# Patient Record
Sex: Female | Born: 1975
Health system: Southern US, Community
[De-identification: ages and names within clinical notes are randomized; demographics above are authoritative.]

## PROBLEM LIST (undated history)

## (undated) DIAGNOSIS — K219 Gastro-esophageal reflux disease without esophagitis: Secondary | ICD-10-CM

## (undated) DIAGNOSIS — E049 Nontoxic goiter, unspecified: Secondary | ICD-10-CM

## (undated) HISTORY — PX: NO PAST SURGERIES: SHX2092

---

## 2008-08-24 ENCOUNTER — Encounter: Admission: RE | Admit: 2008-08-24 | Discharge: 2008-08-24 | Payer: Self-pay | Admitting: Family Medicine

## 2008-09-06 ENCOUNTER — Encounter (INDEPENDENT_AMBULATORY_CARE_PROVIDER_SITE_OTHER): Payer: Self-pay | Admitting: Interventional Radiology

## 2008-09-06 ENCOUNTER — Ambulatory Visit (HOSPITAL_COMMUNITY): Admission: RE | Admit: 2008-09-06 | Discharge: 2008-09-06 | Payer: Self-pay | Admitting: Family Medicine

## 2009-08-26 ENCOUNTER — Encounter: Admission: RE | Admit: 2009-08-26 | Discharge: 2009-08-26 | Payer: Self-pay | Admitting: Internal Medicine

## 2010-09-21 ENCOUNTER — Other Ambulatory Visit (HOSPITAL_COMMUNITY)
Admission: RE | Admit: 2010-09-21 | Discharge: 2010-09-21 | Disposition: A | Discharge status: Home / Self Care | Payer: 59 | Source: Ambulatory Visit | Attending: Family Medicine | Admitting: Family Medicine

## 2010-09-21 ENCOUNTER — Other Ambulatory Visit: Payer: Self-pay | Admitting: Family Medicine

## 2010-09-21 DIAGNOSIS — Z124 Encounter for screening for malignant neoplasm of cervix: Secondary | ICD-10-CM | POA: Insufficient documentation

## 2013-07-21 ENCOUNTER — Other Ambulatory Visit: Payer: Self-pay | Admitting: Family Medicine

## 2013-07-21 DIAGNOSIS — E042 Nontoxic multinodular goiter: Secondary | ICD-10-CM

## 2013-07-30 ENCOUNTER — Ambulatory Visit
Admission: RE | Admit: 2013-07-30 | Discharge: 2013-07-30 | Disposition: A | Discharge status: Home / Self Care | Payer: No Typology Code available for payment source | Source: Ambulatory Visit | Attending: Family Medicine | Admitting: Family Medicine

## 2013-07-30 DIAGNOSIS — E042 Nontoxic multinodular goiter: Secondary | ICD-10-CM

## 2015-02-08 ENCOUNTER — Other Ambulatory Visit: Payer: Self-pay | Admitting: Family Medicine

## 2015-02-08 ENCOUNTER — Other Ambulatory Visit (HOSPITAL_COMMUNITY)
Admission: RE | Admit: 2015-02-08 | Discharge: 2015-02-08 | Disposition: A | Discharge status: Home / Self Care | Payer: 59 | Source: Ambulatory Visit | Attending: Family Medicine | Admitting: Family Medicine

## 2015-02-08 DIAGNOSIS — Z124 Encounter for screening for malignant neoplasm of cervix: Secondary | ICD-10-CM | POA: Diagnosis not present

## 2015-02-10 LAB — CYTOLOGY - PAP

## 2015-02-11 ENCOUNTER — Other Ambulatory Visit: Payer: Self-pay | Admitting: Family Medicine

## 2015-02-11 DIAGNOSIS — E042 Nontoxic multinodular goiter: Secondary | ICD-10-CM

## 2015-02-15 ENCOUNTER — Ambulatory Visit
Admission: RE | Admit: 2015-02-15 | Discharge: 2015-02-15 | Disposition: A | Discharge status: Home / Self Care | Payer: 59 | Source: Ambulatory Visit | Attending: Family Medicine | Admitting: Family Medicine

## 2015-02-15 DIAGNOSIS — E042 Nontoxic multinodular goiter: Secondary | ICD-10-CM

## 2015-03-22 ENCOUNTER — Other Ambulatory Visit: Payer: Self-pay | Admitting: Endocrinology

## 2015-03-22 DIAGNOSIS — E041 Nontoxic single thyroid nodule: Secondary | ICD-10-CM

## 2015-03-30 ENCOUNTER — Ambulatory Visit
Admission: RE | Admit: 2015-03-30 | Discharge: 2015-03-30 | Disposition: A | Discharge status: Home / Self Care | Payer: 59 | Source: Ambulatory Visit | Attending: Endocrinology | Admitting: Endocrinology

## 2015-03-30 ENCOUNTER — Other Ambulatory Visit (HOSPITAL_COMMUNITY)
Admission: RE | Admit: 2015-03-30 | Discharge: 2015-03-30 | Disposition: A | Discharge status: Home / Self Care | Payer: 59 | Source: Ambulatory Visit | Attending: Radiology | Admitting: Radiology

## 2015-03-30 DIAGNOSIS — E041 Nontoxic single thyroid nodule: Secondary | ICD-10-CM | POA: Insufficient documentation

## 2016-02-16 ENCOUNTER — Other Ambulatory Visit: Payer: Self-pay | Admitting: Family Medicine

## 2016-02-16 DIAGNOSIS — N63 Unspecified lump in unspecified breast: Secondary | ICD-10-CM

## 2016-02-16 DIAGNOSIS — E042 Nontoxic multinodular goiter: Secondary | ICD-10-CM

## 2016-02-22 ENCOUNTER — Ambulatory Visit
Admission: RE | Admit: 2016-02-22 | Discharge: 2016-02-22 | Disposition: A | Discharge status: Home / Self Care | Payer: 59 | Source: Ambulatory Visit | Attending: Family Medicine | Admitting: Family Medicine

## 2016-02-22 ENCOUNTER — Other Ambulatory Visit: Payer: Self-pay | Admitting: Family Medicine

## 2016-02-22 DIAGNOSIS — N63 Unspecified lump in unspecified breast: Secondary | ICD-10-CM

## 2016-02-23 ENCOUNTER — Ambulatory Visit
Admission: RE | Admit: 2016-02-23 | Discharge: 2016-02-23 | Disposition: A | Discharge status: Home / Self Care | Payer: 59 | Source: Ambulatory Visit | Attending: Family Medicine | Admitting: Family Medicine

## 2016-02-23 DIAGNOSIS — E042 Nontoxic multinodular goiter: Secondary | ICD-10-CM

## 2016-03-07 ENCOUNTER — Other Ambulatory Visit: Payer: Self-pay | Admitting: Family Medicine

## 2016-03-07 DIAGNOSIS — E041 Nontoxic single thyroid nodule: Secondary | ICD-10-CM

## 2016-03-13 ENCOUNTER — Other Ambulatory Visit (HOSPITAL_COMMUNITY)
Admission: RE | Admit: 2016-03-13 | Discharge: 2016-03-13 | Disposition: A | Discharge status: Home / Self Care | Payer: 59 | Source: Ambulatory Visit | Attending: Radiology | Admitting: Radiology

## 2016-03-13 ENCOUNTER — Ambulatory Visit
Admission: RE | Admit: 2016-03-13 | Discharge: 2016-03-13 | Disposition: A | Discharge status: Home / Self Care | Payer: 59 | Source: Ambulatory Visit | Attending: Family Medicine | Admitting: Family Medicine

## 2016-03-13 DIAGNOSIS — D34 Benign neoplasm of thyroid gland: Secondary | ICD-10-CM | POA: Diagnosis not present

## 2016-03-13 DIAGNOSIS — E041 Nontoxic single thyroid nodule: Secondary | ICD-10-CM

## 2016-07-18 ENCOUNTER — Other Ambulatory Visit: Payer: Self-pay | Admitting: Endocrinology

## 2016-07-18 DIAGNOSIS — E049 Nontoxic goiter, unspecified: Secondary | ICD-10-CM

## 2016-09-20 ENCOUNTER — Ambulatory Visit
Admission: RE | Admit: 2016-09-20 | Discharge: 2016-09-20 | Disposition: A | Discharge status: Home / Self Care | Payer: 59 | Source: Ambulatory Visit | Attending: Endocrinology | Admitting: Endocrinology

## 2016-09-20 DIAGNOSIS — E049 Nontoxic goiter, unspecified: Secondary | ICD-10-CM

## 2017-07-05 ENCOUNTER — Other Ambulatory Visit: Payer: Self-pay | Admitting: Family Medicine

## 2017-07-05 ENCOUNTER — Other Ambulatory Visit (HOSPITAL_COMMUNITY)
Admission: RE | Admit: 2017-07-05 | Discharge: 2017-07-05 | Disposition: A | Discharge status: Home / Self Care | Payer: 59 | Source: Ambulatory Visit | Attending: Family Medicine | Admitting: Family Medicine

## 2017-07-05 DIAGNOSIS — Z124 Encounter for screening for malignant neoplasm of cervix: Secondary | ICD-10-CM | POA: Insufficient documentation

## 2017-07-08 ENCOUNTER — Other Ambulatory Visit: Payer: Self-pay | Admitting: Family Medicine

## 2017-07-08 DIAGNOSIS — N6320 Unspecified lump in the left breast, unspecified quadrant: Secondary | ICD-10-CM

## 2017-07-09 LAB — CYTOLOGY - PAP: Diagnosis: NEGATIVE

## 2017-07-15 ENCOUNTER — Ambulatory Visit
Admission: RE | Admit: 2017-07-15 | Discharge: 2017-07-15 | Disposition: A | Discharge status: Home / Self Care | Payer: 59 | Source: Ambulatory Visit | Attending: Family Medicine | Admitting: Family Medicine

## 2017-07-15 DIAGNOSIS — N6320 Unspecified lump in the left breast, unspecified quadrant: Secondary | ICD-10-CM

## 2017-10-09 ENCOUNTER — Other Ambulatory Visit: Payer: Self-pay | Admitting: Endocrinology

## 2017-10-09 DIAGNOSIS — E049 Nontoxic goiter, unspecified: Secondary | ICD-10-CM

## 2017-10-22 ENCOUNTER — Ambulatory Visit
Admission: RE | Admit: 2017-10-22 | Discharge: 2017-10-22 | Disposition: A | Discharge status: Home / Self Care | Payer: 59 | Source: Ambulatory Visit | Attending: Endocrinology | Admitting: Endocrinology

## 2017-10-22 DIAGNOSIS — E049 Nontoxic goiter, unspecified: Secondary | ICD-10-CM

## 2019-03-03 ENCOUNTER — Emergency Department (HOSPITAL_COMMUNITY)
Admission: EM | Admit: 2019-03-03 | Discharge: 2019-03-04 | Discharge status: Against Medical Advice | Payer: 59 | Attending: Emergency Medicine | Admitting: Emergency Medicine

## 2019-03-03 ENCOUNTER — Encounter (HOSPITAL_COMMUNITY): Payer: Self-pay | Admitting: Emergency Medicine

## 2019-03-03 ENCOUNTER — Other Ambulatory Visit: Payer: Self-pay

## 2019-03-03 DIAGNOSIS — Z5321 Procedure and treatment not carried out due to patient leaving prior to being seen by health care provider: Secondary | ICD-10-CM | POA: Insufficient documentation

## 2019-03-03 HISTORY — DX: Nontoxic goiter, unspecified: E04.9

## 2019-03-03 HISTORY — DX: Gastro-esophageal reflux disease without esophagitis: K21.9

## 2019-03-03 LAB — COMPREHENSIVE METABOLIC PANEL
ALT: 15 U/L (ref 0–44)
AST: 18 U/L (ref 15–41)
Albumin: 3.8 g/dL (ref 3.5–5.0)
Alkaline Phosphatase: 45 U/L (ref 38–126)
Anion gap: 12 (ref 5–15)
BUN: 9 mg/dL (ref 6–20)
CO2: 23 mmol/L (ref 22–32)
Calcium: 9.6 mg/dL (ref 8.9–10.3)
Chloride: 101 mmol/L (ref 98–111)
Creatinine, Ser: 0.77 mg/dL (ref 0.44–1.00)
GFR calc Af Amer: >60 mL/min (ref >60)
GFR calc non Af Amer: >60 mL/min (ref >60)
Glucose, Bld: 147 mg/dL — ABNORMAL HIGH (ref 70–99)
Potassium: 3.9 mmol/L (ref 3.5–5.1)
Sodium: 136 mmol/L (ref 135–145)
Total Bilirubin: 0.8 mg/dL (ref 0.3–1.2)
Total Protein: 7.3 g/dL (ref 6.5–8.1)

## 2019-03-03 LAB — CBC
HCT: 41.1 % (ref 36.0–46.0)
Hemoglobin: 14.9 g/dL (ref 12.0–15.0)
MCH: 32.6 pg (ref 26.0–34.0)
MCHC: 36.3 g/dL — ABNORMAL HIGH (ref 30.0–36.0)
MCV: 89.9 fL (ref 80.0–100.0)
Platelets: 329 10*3/uL (ref 150–400)
RBC: 4.57 MIL/uL (ref 3.87–5.11)
RDW: 11.8 % (ref 11.5–15.5)
WBC: 9.8 10*3/uL (ref 4.0–10.5)
nRBC: 0 % (ref 0.0–0.2)

## 2019-03-03 LAB — URINALYSIS, ROUTINE W REFLEX MICROSCOPIC
Bilirubin Urine: NEGATIVE
Glucose, UA: NEGATIVE mg/dL
Hgb urine dipstick: NEGATIVE
Ketones, ur: 20 mg/dL — AB
Leukocytes,Ua: NEGATIVE
Nitrite: NEGATIVE
Protein, ur: 30 mg/dL — AB
Specific Gravity, Urine: 1.027 (ref 1.005–1.030)
pH: 7 (ref 5.0–8.0)

## 2019-03-03 LAB — LIPASE, BLOOD: Lipase: 22 U/L (ref 11–51)

## 2019-03-03 LAB — I-STAT BETA HCG BLOOD, ED (MC, WL, AP ONLY): I-stat hCG, quantitative: <5 m[IU]/mL (ref <5)

## 2019-03-03 MED ORDER — SODIUM CHLORIDE 0.9% FLUSH: 3.0000 mL | Freq: Once | INTRAVENOUS | Status: DC

## 2019-03-03 NOTE — ED Triage Notes (Signed)
Pt to ED with c/o nausea, vomiting and diarrhea   Onset this am.  Unable to keep anything down

## 2019-03-04 NOTE — ED Notes (Signed)
NO answer when called for bed

## 2019-06-08 ENCOUNTER — Other Ambulatory Visit (HOSPITAL_COMMUNITY): Payer: Self-pay | Admitting: Endocrinology

## 2019-06-08 DIAGNOSIS — E0590 Thyrotoxicosis, unspecified without thyrotoxic crisis or storm: Secondary | ICD-10-CM

## 2019-06-22 ENCOUNTER — Encounter (HOSPITAL_COMMUNITY): Payer: Self-pay

## 2019-06-22 ENCOUNTER — Encounter (HOSPITAL_COMMUNITY): Payer: 59

## 2019-06-23 ENCOUNTER — Encounter (HOSPITAL_COMMUNITY): Payer: 59

## 2019-07-01 ENCOUNTER — Other Ambulatory Visit: Payer: Self-pay

## 2019-07-01 ENCOUNTER — Encounter (HOSPITAL_COMMUNITY)
Admission: RE | Admit: 2019-07-01 | Discharge: 2019-07-01 | Disposition: A | Discharge status: Home / Self Care | Payer: 59 | Source: Ambulatory Visit | Attending: Endocrinology | Admitting: Endocrinology

## 2019-07-01 DIAGNOSIS — E0590 Thyrotoxicosis, unspecified without thyrotoxic crisis or storm: Secondary | ICD-10-CM

## 2019-07-01 MED ORDER — SODIUM IODIDE I-123 7.4 MBQ CAPS
452.2000 | ORAL_CAPSULE | Freq: Once | ORAL | Status: AC
  Administered 2019-07-01: 452.2 via ORAL

## 2019-07-02 ENCOUNTER — Encounter (HOSPITAL_COMMUNITY)
Admission: RE | Admit: 2019-07-02 | Discharge: 2019-07-02 | Disposition: A | Discharge status: Home / Self Care | Payer: 59 | Source: Ambulatory Visit | Attending: Endocrinology | Admitting: Endocrinology

## 2019-07-02 MED ORDER — SODIUM IODIDE I-123 7.4 MBQ CAPS
418.0000 | ORAL_CAPSULE | Freq: Once | ORAL | Status: AC
  Administered 2019-07-02: 17:00:00 418 via ORAL

## 2019-08-25 ENCOUNTER — Other Ambulatory Visit: Payer: Self-pay | Admitting: Endocrinology

## 2019-08-25 DIAGNOSIS — E049 Nontoxic goiter, unspecified: Secondary | ICD-10-CM

## 2020-08-12 DIAGNOSIS — E049 Nontoxic goiter, unspecified: Secondary | ICD-10-CM | POA: Diagnosis not present

## 2020-08-19 DIAGNOSIS — E049 Nontoxic goiter, unspecified: Secondary | ICD-10-CM | POA: Diagnosis not present

## 2020-08-19 DIAGNOSIS — E0590 Thyrotoxicosis, unspecified without thyrotoxic crisis or storm: Secondary | ICD-10-CM | POA: Diagnosis not present

## 2020-08-24 ENCOUNTER — Other Ambulatory Visit: Payer: 59

## 2020-09-06 ENCOUNTER — Ambulatory Visit
Admission: RE | Admit: 2020-09-06 | Discharge: 2020-09-06 | Disposition: A | Discharge status: Home / Self Care | Payer: Self-pay | Source: Ambulatory Visit | Attending: Endocrinology | Admitting: Endocrinology

## 2020-09-06 DIAGNOSIS — E049 Nontoxic goiter, unspecified: Secondary | ICD-10-CM

## 2021-02-14 IMAGING — NM NM THYROID IMAGING W/ UPTAKE MULTI (4&24 HR)
4 series · 4 of 4 positions shown · non-contrast
Comparison: Thyroid ultrasound 10/22/2017

CLINICAL DATA: Thyrotoxicosis without thyroid storm

EXAM:
THYROID SCAN AND UPTAKE - 4 AND 24 HOURS
TECHNIQUE: Following oral administration of 6-5OM capsule, anterior planar
imaging was acquired at 24 hours. Thyroid uptake was calculated with
a thyroid probe at 4-6 hours and 24 hours.
RADIOPHARMACEUTICALS:  452.2 uCi 6-5OM sodium iodide p.o.

[iu thyroid uptake i123 · 3.10mm/px · 1 of 1 slices shown (1 of 4)]
[im 1/1  full-range]
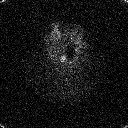

[iu thyroid uptake i123 · 3.10mm/px · 1 of 1 slices shown (2 of 4)]
[im 1/1  full-range]
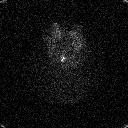

[iu thyroid uptake i123 · 3.10mm/px · 1 of 1 slices shown (3 of 4)]
[im 1/1  full-range]
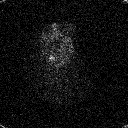

[iu thyroid uptake i123 · 3.10mm/px · 1 of 1 slices shown (4 of 4)]
[im 1/1  full-range]
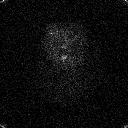

[4 of 4 positions shown; findings below may reference images not displayed]

FINDINGS: The thyroid lobes appear enlarged and heterogeneous bilaterally
containing areas of increased and decreased tracer localization.
Findings are consistent with a multinodular thyroid gland, as
identified on prior ultrasound

4 hour 6-5OM uptake = 2.5% (normal 5-20%)

24 hour 6-5OM uptake = 4.1% (normal 10-30%)
IMPRESSION: Decreased 4 hour and 24 hour radio iodine uptakes.

Enlarged multinodular thyroid lobes, containing warm and cold
nodules.

Patient has had multiple prior thyroid biopsies.

## 2021-06-02 DIAGNOSIS — J208 Acute bronchitis due to other specified organisms: Secondary | ICD-10-CM | POA: Diagnosis not present

## 2021-08-16 DIAGNOSIS — E119 Type 2 diabetes mellitus without complications: Secondary | ICD-10-CM | POA: Diagnosis not present

## 2021-08-16 DIAGNOSIS — E049 Nontoxic goiter, unspecified: Secondary | ICD-10-CM | POA: Diagnosis not present

## 2021-08-21 DIAGNOSIS — E049 Nontoxic goiter, unspecified: Secondary | ICD-10-CM | POA: Diagnosis not present

## 2021-08-21 DIAGNOSIS — E0590 Thyrotoxicosis, unspecified without thyrotoxic crisis or storm: Secondary | ICD-10-CM | POA: Diagnosis not present

## 2022-08-15 DIAGNOSIS — E049 Nontoxic goiter, unspecified: Secondary | ICD-10-CM | POA: Diagnosis not present

## 2022-08-22 DIAGNOSIS — E049 Nontoxic goiter, unspecified: Secondary | ICD-10-CM | POA: Diagnosis not present

## 2022-08-22 DIAGNOSIS — E0590 Thyrotoxicosis, unspecified without thyrotoxic crisis or storm: Secondary | ICD-10-CM | POA: Diagnosis not present

## 2022-10-04 ENCOUNTER — Ambulatory Visit: Payer: Self-pay | Admitting: Surgery

## 2022-10-04 DIAGNOSIS — E040 Nontoxic diffuse goiter: Secondary | ICD-10-CM | POA: Diagnosis not present

## 2022-11-05 DIAGNOSIS — E049 Nontoxic goiter, unspecified: Secondary | ICD-10-CM | POA: Diagnosis not present

## 2022-11-20 ENCOUNTER — Ambulatory Visit: Payer: Self-pay | Admitting: Surgery

## 2022-11-23 ENCOUNTER — Encounter (HOSPITAL_COMMUNITY): Payer: Self-pay

## 2022-12-28 NOTE — Progress Notes (Addendum)
COVID Vaccine Completed: yes  Date of COVID positive in last 90 days: no  PCP - Juluis Rainier, MD Cardiologist - n/a  Chest x-ray - n/a EKG - n/a Stress Test - n/a ECHO - n/a Cardiac Cath - n/a Pacemaker/ICD device last checked: n/a Spinal Cord Stimulator: n/a  Bowel Prep - no  Sleep Study - n/a CPAP -   Fasting Blood Sugar - n/a Checks Blood Sugar _____ times a day  Last dose of GLP1 agonist-  N/A GLP1 instructions:  N/A   Last dose of SGLT-2 inhibitors-  N/A SGLT-2 instructions: N/A   Blood Thinner Instructions: n/a Aspirin Instructions: Last Dose:  Activity level: Can go up a flight of stairs and perform activities of daily living without stopping and without symptoms of chest pain or shortness of breath.   Anesthesia review: BP 149/99, 142/101, and 136/98. Denies symptoms or history of high BP. Will obtain EKG. Instructed to monitor at home and reach out to PCP if still elevated. Large goiter to neck, denies difficulty breathing or swallowing.   Patient denies shortness of breath, fever, cough and chest pain at PAT appointment  Patient verbalized understanding of instructions that were given to them at the PAT appointment. Patient was also instructed that they will need to review over the PAT instructions again at home before surgery.

## 2022-12-28 NOTE — Patient Instructions (Addendum)
SURGICAL WAITING ROOM VISITATION  Patients having surgery or a procedure may have no more than 2 support people in the waiting area - these visitors may rotate.    Children under the age of 45 must have an adult with them who is not the patient.  Due to an increase in RSV and influenza rates and associated hospitalizations, children ages 74 and under may not visit patients in [facility] hospitals.  If the patient needs to stay at the hospital during part of their recovery, the visitor guidelines for inpatient rooms apply. Pre-op nurse will coordinate an appropriate time for 1 support person to accompany patient in pre-op.  This support person may not rotate.    Please refer to the [facility] website for the visitor guidelines for Inpatients (after your surgery is over and you are in a regular room).    Your procedure is scheduled on: 01/14/23   Report to [facility] Main Entrance    Report to admitting at 5:15 AM   Call this number if you have problems the morning of surgery [phone]   Do not eat food :After Midnight.   After Midnight you may have the following liquids until 4:30 AM DAY OF SURGERY  Water Non-Citrus Juices (without pulp, NO RED-Apple, White grape, White cranberry) Black Coffee (NO MILK/CREAM OR CREAMERS, sugar ok)  Clear Tea (NO MILK/CREAM OR CREAMERS, sugar ok) regular and decaf                             Plain Jell-O (NO RED)                                           Fruit ices (not with fruit pulp, NO RED)                                     Popsicles (NO RED)                                                               Sports drinks like Gatorade (NO RED)          If you have questions, please contact your surgeon's office.   FOLLOW BOWEL PREP AND ANY ADDITIONAL PRE OP INSTRUCTIONS YOU RECEIVED FROM YOUR SURGEON'S OFFICE!!!     Oral Hygiene is also important to reduce your risk of infection.                                    Remember -  BRUSH YOUR TEETH THE MORNING OF SURGERY WITH YOUR REGULAR TOOTHPASTE  DENTURES WILL BE REMOVED PRIOR TO SURGERY PLEASE DO NOT APPLY "Poly grip" OR ADHESIVES!!!   Take these medicines the morning of surgery with A SIP OF WATER: none                              You may not have any metal on your body  including hair pins, jewelry, and body piercing             Do not wear make-up, lotions, powders, perfumes, or deodorant  Do not wear nail polish including gel and S&S, artificial/acrylic nails, or any other type of covering on natural nails including finger and toenails. If you have artificial nails, gel coating, etc. that needs to be removed by a nail salon please have this removed prior to surgery or surgery may need to be canceled/ delayed if the surgeon/ anesthesia feels like they are unable to be safely monitored.   Do not shave  48 hours prior to surgery.    Do not bring valuables to the hospital. [facility] IS NOT             RESPONSIBLE   FOR VALUABLES.   Contacts, glasses, dentures or bridgework may not be worn into surgery.   Bring small overnight bag day of surgery.   DO NOT BRING YOUR HOME MEDICATIONS TO THE HOSPITAL. PHARMACY WILL DISPENSE MEDICATIONS LISTED ON YOUR MEDICATION LIST TO YOU DURING YOUR ADMISSION IN THE HOSPITAL!   Special Instructions: Bring a copy of your healthcare power of attorney and living will documents the day of surgery if you haven't scanned them before.              Please read over the following fact sheets you were given: IF YOU HAVE QUESTIONS ABOUT YOUR PRE-OP INSTRUCTIONS PLEASE CALL [phone] Contras   If you received a COVID test during your pre-op visit  it is requested that you wear a mask when out in public, stay away from anyone that may not be feeling well and notify your surgeon if you develop symptoms. If you test positive for Covid or have been in contact with anyone that has tested positive in the last 10 days please notify you  surgeon.    [facility] - Preparing for Surgery Before surgery, you can play an important role.  Because skin is not sterile, your skin needs to be as free of germs as possible.  You can reduce the number of germs on your skin by washing with CHG (chlorahexidine gluconate) soap before surgery.  CHG is an antiseptic cleaner which kills germs and bonds with the skin to continue killing germs even after washing. Please DO NOT use if you have an allergy to CHG or antibacterial soaps.  If your skin becomes reddened/irritated stop using the CHG and inform your nurse when you arrive at Short Stay. Do not shave (including legs and underarms) for at least 48 hours prior to the first CHG shower.  You may shave your face/neck.  Please follow these instructions carefully:  1.  Shower with CHG Soap the night before surgery and the  morning of surgery.  2.  If you choose to wash your hair, wash your hair first as usual with your normal  shampoo.  3.  After you shampoo, rinse your hair and body thoroughly to remove the shampoo.                             4.  Use CHG as you would any other liquid soap.  You can apply chg directly to the skin and wash.  Gently with a scrungie or clean washcloth.  5.  Apply the CHG Soap to your body ONLY FROM THE NECK DOWN.   Do   not use on face/ open  Wound or open sores. Avoid contact with eyes, ears mouth and   genitals (private parts).                       Wash face,  Genitals (private parts) with your normal soap.             6.  Wash thoroughly, paying special attention to the area where your    surgery  will be performed.  7.  Thoroughly rinse your body with warm water from the neck down.  8.  DO NOT shower/wash with your normal soap after using and rinsing off the CHG Soap.                9.  Pat yourself dry with a clean towel.            10.  Wear clean pajamas.            11.  Place clean sheets on your bed the night of your first shower and  do not  sleep with pets. Day of Surgery : Do not apply any lotions/deodorants the morning of surgery.  Please wear clean clothes to the hospital/surgery center.  FAILURE TO FOLLOW THESE INSTRUCTIONS MAY RESULT IN THE CANCELLATION OF YOUR SURGERY  PATIENT SIGNATURE_________________________________  NURSE SIGNATURE__________________________________  ________________________________________________________________________

## 2023-01-01 ENCOUNTER — Encounter (HOSPITAL_COMMUNITY)
Admission: RE | Admit: 2023-01-01 | Discharge: 2023-01-01 | Disposition: A | Discharge status: Home / Self Care | Payer: BC Managed Care – PPO | Source: Ambulatory Visit | Attending: Surgery | Admitting: Surgery

## 2023-01-01 ENCOUNTER — Encounter (HOSPITAL_COMMUNITY): Payer: Self-pay

## 2023-01-01 ENCOUNTER — Other Ambulatory Visit: Payer: Self-pay

## 2023-01-01 VITALS — BP 136/98 | HR 83 | Temp 98.8°F | Resp 18 | Ht 68.0 in | Wt 231.0 lb

## 2023-01-01 DIAGNOSIS — Z01818 Encounter for other preprocedural examination: Secondary | ICD-10-CM | POA: Diagnosis not present

## 2023-01-01 DIAGNOSIS — I10 Essential (primary) hypertension: Secondary | ICD-10-CM | POA: Insufficient documentation

## 2023-01-01 LAB — CBC
HCT: 44.5 % (ref 36.0–46.0)
Hemoglobin: 14.8 g/dL (ref 12.0–15.0)
MCH: 31.2 pg (ref 26.0–34.0)
MCHC: 33.3 g/dL (ref 30.0–36.0)
MCV: 93.7 fL (ref 80.0–100.0)
Platelets: 309 10*3/uL (ref 150–400)
RBC: 4.75 MIL/uL (ref 3.87–5.11)
RDW: 11.9 % (ref 11.5–15.5)
WBC: 7.3 10*3/uL (ref 4.0–10.5)
nRBC: 0 % (ref 0.0–0.2)

## 2023-01-01 LAB — BASIC METABOLIC PANEL
Anion gap: 6 (ref 5–15)
BUN: 15 mg/dL (ref 6–20)
CO2: 25 mmol/L (ref 22–32)
Calcium: 9 mg/dL (ref 8.9–10.3)
Chloride: 106 mmol/L (ref 98–111)
Creatinine, Ser: 0.73 mg/dL (ref 0.44–1.00)
GFR, Estimated: >60 mL/min (ref >60)
Glucose, Bld: 113 mg/dL — ABNORMAL HIGH (ref 70–99)
Potassium: 4.5 mmol/L (ref 3.5–5.1)
Sodium: 137 mmol/L (ref 135–145)

## 2023-01-11 NOTE — Anesthesia Preprocedure Evaluation (Addendum)
Anesthesia Evaluation  Patient identified by MRN, date of birth, ID band Patient awake    Reviewed: Allergy & Precautions, H&P , NPO status , Patient's Chart, lab work & pertinent test results  Airway Mallampati: II  TM Distance: >3 FB Neck ROM: Full    Dental no notable dental hx. (+) Teeth Intact, Dental Advisory Given   Pulmonary neg pulmonary ROS   Pulmonary exam normal breath sounds clear to auscultation       Cardiovascular Exercise Tolerance: Good negative cardio ROS  Rhythm:Regular Rate:Normal     Neuro/Psych negative neurological ROS  negative psych ROS   GI/Hepatic negative GI ROS, Neg liver ROS,GERD  ,,  Endo/Other  negative endocrine ROS    Renal/GU negative Renal ROS  negative genitourinary   Musculoskeletal   Abdominal   Peds  Hematology negative hematology ROS (+)   Anesthesia Other Findings   Reproductive/Obstetrics negative OB ROS                             Anesthesia Physical Anesthesia Plan  ASA: 2  Anesthesia Plan: General   Post-op Pain Management: Tylenol PO (pre-op)*   Induction: Intravenous  PONV Risk Score and Plan: 4 or greater and Ondansetron, Dexamethasone and Midazolam  Airway Management Planned: Oral ETT  Additional Equipment:   Intra-op Plan:   Post-operative Plan: Extubation in OR  Informed Consent: I have reviewed the patients History and Physical, chart, labs and discussed the procedure including the risks, benefits and alternatives for the proposed anesthesia with the patient or authorized representative who has indicated his/her understanding and acceptance.     Dental advisory given  Plan Discussed with: CRNA  Anesthesia Plan Comments: (CT results on physical chart. Per CT, right thyroid lobe measuring approximately 5.3 x 4.0 x 7.9cm. Left thyroid lobe measures approximately 11.2 x 13.3 cm. Mass effect on the trachea which is  displaced to the right, but remains patent. Pt denies dysphagia, occasional episodes of nocturnal dyspnea, mild shortness of breath with raising both arms above her head per notes. No h/o pulmonary disease.  )       Anesthesia Quick Evaluation

## 2023-01-13 ENCOUNTER — Encounter (HOSPITAL_COMMUNITY): Payer: Self-pay | Admitting: Surgery

## 2023-01-13 DIAGNOSIS — E049 Nontoxic goiter, unspecified: Secondary | ICD-10-CM | POA: Diagnosis present

## 2023-01-13 DIAGNOSIS — Z8639 Personal history of other endocrine, nutritional and metabolic disease: Secondary | ICD-10-CM

## 2023-01-13 NOTE — H&P (Signed)
REFERRING PHYSICIAN: Lelan Pons, MD  PROVIDER: Georgeanna Radziewicz Myra Rude, MD   Chief Complaint: New Consultation (Thyroid goiter, history of hyperthyroidism)  History of Present Illness:  Patient is referred by her endocrinologist, Dr. Dorisann Frames, for consideration for thyroidectomy for treatment of diffuse thyroid goiter with a history of thyrotoxicosis. Patient was first diagnosed approximately 15 years ago with thyroid goiter. Patient experienced gradual increase in size and an attempt was made at suppression using levothyroxine. However, the patient became hyperthyroid with nodules. Fine-needle aspiration biopsies are reportedly benign. Patient subsequently was treated with a dose of radioactive iodine with minimal improvement. The patient has continued to have increase in size of the thyroid gland however her recent TSH levels have been within the normal range with no recurrence of thyrotoxicosis. Her most recent TSH level was 0.729. The patient is not on any thyroid medication including methimazole and beta-blockers. Patient denies any tremors or palpitations. She denies any compressive symptoms. She has no dysphagia. She may have occasional episodes of nocturnal dyspnea. She also notes mild shortness of breath upon raising both arms above her head, Trousseau's sign. Patient had a thyroid ultrasound in 2022 but has not had any recent studies. It does not appear that she has had a CT scan. 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 She travels frequently.  Review of Systems: A complete review of systems was obtained from the patient. I have reviewed this information and discussed as appropriate with the patient. See HPI as well for other ROS.  Review of Systems  Constitutional: Negative.  HENT: Negative.  Eyes: Negative.  Respiratory: Positive for shortness of breath.  Cardiovascular: Negative.  Gastrointestinal: Negative.  Genitourinary: Negative.   Musculoskeletal: Negative.  Skin: Negative.  Neurological: Negative.  Endo/Heme/Allergies: Negative.  Psychiatric/Behavioral: Negative.    Medical History: History reviewed. No pertinent past medical history.  Patient Active Problem List  Diagnosis  Diffuse thyroid goiter without thyrotoxicosis   History reviewed. No pertinent surgical history.   Not on File  No current outpatient medications on file prior to visit.   No current facility-administered medications on file prior to visit.   History reviewed. No pertinent family history.   Social History   Tobacco Use  Smoking Status Not on file  Smokeless Tobacco Not on file    Social History   Socioeconomic History  Marital status: Single   Objective:   There were no vitals filed for this visit.  There is no height or weight on file to calculate BMI.  Physical Exam   GENERAL APPEARANCE Comfortable, no acute issues Development: normal Gross deformities: none  SKIN Rash, lesions, ulcers: none Induration, erythema: none Nodules: none palpable  EYES Conjunctiva and lids: normal Pupils: equal and reactive  EARS, NOSE, MOUTH, THROAT External ears: no lesion or deformity External nose: no lesion or deformity Hearing: grossly normal  NECK Symmetric: no Trachea: unknown Thyroid: Thyroid gland is markedly enlarged and situated relatively anteriorly in the neck. It appears that there is a large mass within the thyroid isthmus measuring approximately 6 to 7 cm in diameter. There are no discrete or dominant nodules present. There is no tenderness. There is no associated lymphadenopathy. There is no stridor.  CHEST Respiratory effort: normal Retraction or accessory muscle use: no Breath sounds: normal bilaterally Rales, rhonchi, wheeze: none  CARDIOVASCULAR Auscultation: regular rhythm, normal rate Murmurs: none Pulses: radial pulse 2+ palpable Lower extremity edema: none  ABDOMEN Not  assessed  GENITOURINARY/RECTAL Not assessed  MUSCULOSKELETAL  Station and gait: normal Digits and nails: no clubbing or cyanosis Muscle strength: grossly normal all extremities Range of motion: grossly normal all extremities Deformity: none  LYMPHATIC Cervical: none palpable Supraclavicular: none palpable  PSYCHIATRIC Oriented to person, place, and time: yes Mood and affect: normal for situation Judgment and insight: appropriate for situation   Assessment and Plan:   Diffuse thyroid goiter without thyrotoxicosis  Patient is referred by her endocrinologist, Dr. Dorisann Frames, for surgical evaluation and management of a diffuse nontoxic thyroid goiter.  Patient provided with a copy of "The Thyroid Book: Medical and Surgical Treatment of Thyroid Problems", published by Krames, 16 pages. Book reviewed and explained to patient during visit today.  Today we reviewed her clinical history. We reviewed imaging studies from 2022. We reviewed her most recent TSH level. Patient would like to proceed with thyroidectomy. We discussed the procedure of total thyroidectomy. We discussed the size and location of the surgical incision. We discussed the risk and benefits of the surgery including the risk of recurrent laryngeal nerve injury causing hoarseness and the risk of injury to parathyroid glands causing alteration in calcium metabolism. We discussed the hospital stay to be anticipated. We discussed her postoperative recovery and returned to work and activities. We discussed the need for lifelong thyroid hormone replacement.  I would like to obtain a CT scan of the neck to evaluate the extent of the goiter and to evaluate the airway. I think this will be necessary preoperatively for anesthesia evaluation.  After our discussion today, the patient would like to proceed with thyroidectomy. We will make arrangements for surgery at a time convenient for the patient in the near future.  Darnell Level,  MD [facility] A [facility] practice Office: [phone]

## 2023-01-14 ENCOUNTER — Ambulatory Visit (HOSPITAL_COMMUNITY): Payer: BC Managed Care – PPO | Admitting: Physician Assistant

## 2023-01-14 ENCOUNTER — Ambulatory Visit (HOSPITAL_COMMUNITY)
Admission: RE | Admit: 2023-01-14 | Discharge: 2023-01-15 | Disposition: A | Discharge status: Home / Self Care | Payer: BC Managed Care – PPO | Source: Ambulatory Visit | Attending: Surgery | Admitting: Surgery

## 2023-01-14 ENCOUNTER — Ambulatory Visit (HOSPITAL_COMMUNITY): Payer: BC Managed Care – PPO | Admitting: Anesthesiology

## 2023-01-14 ENCOUNTER — Other Ambulatory Visit: Payer: Self-pay

## 2023-01-14 ENCOUNTER — Encounter (HOSPITAL_COMMUNITY)
Admission: RE | Disposition: A | Discharge status: Home / Self Care | Payer: Self-pay | Source: Ambulatory Visit | Attending: Surgery

## 2023-01-14 ENCOUNTER — Encounter (HOSPITAL_COMMUNITY): Payer: Self-pay | Admitting: Surgery

## 2023-01-14 DIAGNOSIS — E049 Nontoxic goiter, unspecified: Secondary | ICD-10-CM | POA: Diagnosis present

## 2023-01-14 DIAGNOSIS — E0500 Thyrotoxicosis with diffuse goiter without thyrotoxic crisis or storm: Secondary | ICD-10-CM | POA: Diagnosis not present

## 2023-01-14 DIAGNOSIS — Z8639 Personal history of other endocrine, nutritional and metabolic disease: Secondary | ICD-10-CM

## 2023-01-14 DIAGNOSIS — E040 Nontoxic diffuse goiter: Secondary | ICD-10-CM | POA: Diagnosis not present

## 2023-01-14 HISTORY — PX: THYROIDECTOMY: SHX17

## 2023-01-14 LAB — POCT PREGNANCY, URINE: Preg Test, Ur: NEGATIVE

## 2023-01-14 SURGERY — THYROIDECTOMY
Anesthesia: General

## 2023-01-14 MED ORDER — HYDROMORPHONE HCL 1 MG/ML IJ SOLN
INTRAMUSCULAR | Status: AC
  Administered 2023-01-14: 0.5 mg via INTRAVENOUS
  Filled 2023-01-14: qty 1

## 2023-01-14 MED ORDER — ONDANSETRON HCL 4 MG/2ML IJ SOLN
INTRAMUSCULAR | Status: DC | PRN
  Administered 2023-01-14: 4 mg via INTRAVENOUS

## 2023-01-14 MED ORDER — DEXAMETHASONE SODIUM PHOSPHATE 10 MG/ML IJ SOLN
INTRAMUSCULAR | Status: AC
  Filled 2023-01-14: qty 1

## 2023-01-14 MED ORDER — ONDANSETRON 4 MG PO TBDP: 4.0000 mg | ORAL_TABLET | Freq: Four times a day (QID) | ORAL | Status: DC | PRN

## 2023-01-14 MED ORDER — HYDROMORPHONE HCL 1 MG/ML IJ SOLN
0.2500 mg | INTRAMUSCULAR | Status: DC | PRN
  Administered 2023-01-14 (×2): 0.5 mg via INTRAVENOUS

## 2023-01-14 MED ORDER — TRAMADOL HCL 50 MG PO TABS
50.0000 mg | ORAL_TABLET | Freq: Four times a day (QID) | ORAL | Status: DC | PRN
  Administered 2023-01-14 – 2023-01-15 (×3): 50 mg via ORAL
  Filled 2023-01-14 (×3): qty 1

## 2023-01-14 MED ORDER — CHLORHEXIDINE GLUCONATE 0.12 % MT SOLN
15.0000 mL | Freq: Once | OROMUCOSAL | Status: AC
  Administered 2023-01-14: 15 mL via OROMUCOSAL

## 2023-01-14 MED ORDER — PHENYLEPHRINE 80 MCG/ML (10ML) SYRINGE FOR IV PUSH (FOR BLOOD PRESSURE SUPPORT)
PREFILLED_SYRINGE | INTRAVENOUS | Status: AC
  Filled 2023-01-14: qty 10

## 2023-01-14 MED ORDER — PHENYLEPHRINE HCL (PRESSORS) 10 MG/ML IV SOLN
INTRAVENOUS | Status: AC
  Filled 2023-01-14: qty 1

## 2023-01-14 MED ORDER — MIDAZOLAM HCL 2 MG/2ML IJ SOLN
INTRAMUSCULAR | Status: AC
  Filled 2023-01-14: qty 2

## 2023-01-14 MED ORDER — ONDANSETRON HCL 4 MG/2ML IJ SOLN: 4.0000 mg | Freq: Four times a day (QID) | INTRAMUSCULAR | Status: DC | PRN

## 2023-01-14 MED ORDER — PHENYLEPHRINE HCL-NACL 20-0.9 MG/250ML-% IV SOLN
INTRAVENOUS | Status: DC | PRN
Start: 2023-01-14 — End: 2023-01-14
  Administered 2023-01-14: 40 ug/min via INTRAVENOUS

## 2023-01-14 MED ORDER — CALCIUM CARBONATE 1250 (500 CA) MG PO TABS
2.0000 | ORAL_TABLET | Freq: Three times a day (TID) | ORAL | Status: DC
  Administered 2023-01-14 – 2023-01-15 (×3): 2500 mg via ORAL
  Filled 2023-01-14 (×3): qty 2

## 2023-01-14 MED ORDER — ROCURONIUM BROMIDE 10 MG/ML (PF) SYRINGE
PREFILLED_SYRINGE | INTRAVENOUS | Status: AC
  Filled 2023-01-14: qty 10

## 2023-01-14 MED ORDER — FENTANYL CITRATE (PF) 100 MCG/2ML IJ SOLN
INTRAMUSCULAR | Status: AC
  Filled 2023-01-14: qty 2

## 2023-01-14 MED ORDER — 0.9 % SODIUM CHLORIDE (POUR BTL) OPTIME
TOPICAL | Status: DC | PRN
  Administered 2023-01-14: 1000 mL

## 2023-01-14 MED ORDER — HYDROMORPHONE HCL 1 MG/ML IJ SOLN
INTRAMUSCULAR | Status: AC
  Filled 2023-01-14: qty 1

## 2023-01-14 MED ORDER — FENTANYL CITRATE (PF) 100 MCG/2ML IJ SOLN
INTRAMUSCULAR | Status: DC | PRN
  Administered 2023-01-14 (×2): 25 ug via INTRAVENOUS
  Administered 2023-01-14: 50 ug via INTRAVENOUS
  Administered 2023-01-14: 100 ug via INTRAVENOUS

## 2023-01-14 MED ORDER — LIDOCAINE HCL (PF) 2 % IJ SOLN
INTRAMUSCULAR | Status: AC
  Filled 2023-01-14: qty 5

## 2023-01-14 MED ORDER — ROCURONIUM BROMIDE 10 MG/ML (PF) SYRINGE
PREFILLED_SYRINGE | INTRAVENOUS | Status: DC | PRN
  Administered 2023-01-14: 20 mg via INTRAVENOUS
  Administered 2023-01-14: 10 mg via INTRAVENOUS
  Administered 2023-01-14: 60 mg via INTRAVENOUS

## 2023-01-14 MED ORDER — LACTATED RINGERS IV SOLN: INTRAVENOUS | Status: DC

## 2023-01-14 MED ORDER — DEXAMETHASONE SODIUM PHOSPHATE 10 MG/ML IJ SOLN
INTRAMUSCULAR | Status: DC | PRN
  Administered 2023-01-14: 4 mg via INTRAVENOUS

## 2023-01-14 MED ORDER — HYDROMORPHONE HCL 1 MG/ML IJ SOLN: 1.0000 mg | INTRAMUSCULAR | Status: DC | PRN

## 2023-01-14 MED ORDER — HEMOSTATIC AGENTS (NO CHARGE) OPTIME
TOPICAL | Status: DC | PRN
  Administered 2023-01-14: 1

## 2023-01-14 MED ORDER — SODIUM CHLORIDE 0.45 % IV SOLN: INTRAVENOUS | Status: DC

## 2023-01-14 MED ORDER — SUGAMMADEX SODIUM 200 MG/2ML IV SOLN
INTRAVENOUS | Status: DC | PRN
  Administered 2023-01-14: 210 mg via INTRAVENOUS

## 2023-01-14 MED ORDER — PHENYLEPHRINE 80 MCG/ML (10ML) SYRINGE FOR IV PUSH (FOR BLOOD PRESSURE SUPPORT)
PREFILLED_SYRINGE | INTRAVENOUS | Status: DC | PRN
  Administered 2023-01-14 (×4): 80 ug via INTRAVENOUS
  Administered 2023-01-14: 240 ug via INTRAVENOUS
  Administered 2023-01-14 (×2): 80 ug via INTRAVENOUS

## 2023-01-14 MED ORDER — CEFAZOLIN SODIUM-DEXTROSE 2-4 GM/100ML-% IV SOLN
2.0000 g | Freq: Once | INTRAVENOUS | Status: AC
  Administered 2023-01-14: 2 g via INTRAVENOUS
  Filled 2023-01-14: qty 100

## 2023-01-14 MED ORDER — ACETAMINOPHEN 650 MG RE SUPP: 650.0000 mg | Freq: Four times a day (QID) | RECTAL | Status: DC | PRN

## 2023-01-14 MED ORDER — ACETAMINOPHEN 325 MG PO TABS
650.0000 mg | ORAL_TABLET | Freq: Four times a day (QID) | ORAL | Status: DC | PRN
  Filled 2023-01-14: qty 2

## 2023-01-14 MED ORDER — ORAL CARE MOUTH RINSE: 15.0000 mL | Freq: Once | OROMUCOSAL | Status: AC

## 2023-01-14 MED ORDER — ACETAMINOPHEN 500 MG PO TABS
1000.0000 mg | ORAL_TABLET | Freq: Once | ORAL | Status: AC
  Administered 2023-01-14: 1000 mg via ORAL
  Filled 2023-01-14: qty 2

## 2023-01-14 MED ORDER — MIDAZOLAM HCL 2 MG/2ML IJ SOLN
INTRAMUSCULAR | Status: DC | PRN
  Administered 2023-01-14: 2 mg via INTRAVENOUS

## 2023-01-14 MED ORDER — OXYCODONE HCL 5 MG PO TABS: 5.0000 mg | ORAL_TABLET | ORAL | Status: DC | PRN

## 2023-01-14 MED ORDER — PROPOFOL 10 MG/ML IV BOLUS
INTRAVENOUS | Status: AC
  Filled 2023-01-14: qty 20

## 2023-01-14 MED ORDER — PROPOFOL 10 MG/ML IV BOLUS
INTRAVENOUS | Status: DC | PRN
Start: 2023-01-14 — End: 2023-01-14
  Administered 2023-01-14: 200 mg via INTRAVENOUS

## 2023-01-14 MED ORDER — LIDOCAINE 2% (20 MG/ML) 5 ML SYRINGE
INTRAMUSCULAR | Status: DC | PRN
  Administered 2023-01-14: 60 mg via INTRAVENOUS

## 2023-01-14 MED ORDER — ONDANSETRON HCL 4 MG/2ML IJ SOLN
INTRAMUSCULAR | Status: AC
  Filled 2023-01-14: qty 2

## 2023-01-14 SURGICAL SUPPLY — 34 items
ADH SKN CLS APL DERMABOND .7 (GAUZE/BANDAGES/DRESSINGS) ×1
APL PRP STRL LF DISP 70% ISPRP (MISCELLANEOUS) ×1
ATTRACTOMAT 16X20 MAGNETIC DRP (DRAPES) ×1 IMPLANT
BAG COUNTER SPONGE SURGICOUNT (BAG) ×1 IMPLANT
BAG SPNG CNTER NS LX DISP (BAG) ×1
BLADE SURG 15 STRL LF DISP TIS (BLADE) ×1 IMPLANT
BLADE SURG 15 STRL SS (BLADE) ×1
CHLORAPREP W/TINT 26 (MISCELLANEOUS) ×1 IMPLANT
CLIP TI LARGE 6 (CLIP) IMPLANT
CLIP TI MEDIUM 6 (CLIP) ×2 IMPLANT
CLIP TI WIDE RED SMALL 6 (CLIP) ×2 IMPLANT
COVER SURGICAL LIGHT HANDLE (MISCELLANEOUS) ×1 IMPLANT
DERMABOND ADVANCED .7 DNX12 (GAUZE/BANDAGES/DRESSINGS) ×1 IMPLANT
DRAPE LAPAROTOMY T 98X78 PEDS (DRAPES) ×1 IMPLANT
DRAPE UTILITY XL STRL (DRAPES) ×1 IMPLANT
ELECT PENCIL ROCKER SW 15FT (MISCELLANEOUS) ×1 IMPLANT
ELECT REM PT RETURN 15FT ADLT (MISCELLANEOUS) ×1 IMPLANT
GAUZE 4X4 16PLY ~~LOC~~+RFID DBL (SPONGE) ×1 IMPLANT
GLOVE SURG ORTHO 8.0 STRL STRW (GLOVE) ×1 IMPLANT
GOWN STRL REUS W/ TWL XL LVL3 (GOWN DISPOSABLE) ×2 IMPLANT
GOWN STRL REUS W/TWL XL LVL3 (GOWN DISPOSABLE) ×2
HEMOSTAT SURGICEL 2X4 FIBR (HEMOSTASIS) ×1 IMPLANT
ILLUMINATOR WAVEGUIDE N/F (MISCELLANEOUS) ×1 IMPLANT
KIT BASIN OR (CUSTOM PROCEDURE TRAY) ×1 IMPLANT
KIT TURNOVER KIT A (KITS) IMPLANT
PACK BASIC VI WITH GOWN DISP (CUSTOM PROCEDURE TRAY) ×1 IMPLANT
SHEARS HARMONIC 9CM CVD (BLADE) ×1 IMPLANT
SUT MNCRL AB 4-0 PS2 18 (SUTURE) ×1 IMPLANT
SUT SILK 3 0 SH 30 (SUTURE) ×1 IMPLANT
SUT VIC AB 3-0 SH 18 (SUTURE) ×2 IMPLANT
SYR BULB IRRIG 60ML STRL (SYRINGE) ×1 IMPLANT
TOWEL OR 17X26 10 PK STRL BLUE (TOWEL DISPOSABLE) ×1 IMPLANT
TOWEL OR NON WOVEN STRL DISP B (DISPOSABLE) ×1 IMPLANT
TUBING CONNECTING 10 (TUBING) ×1 IMPLANT

## 2023-01-14 NOTE — Transfer of Care (Signed)
Immediate Anesthesia Transfer of Care Note  Patient: Sandra Fox  Procedure(s) Performed: TOTAL THYROIDECTOMY  Patient Location: PACU  Anesthesia Type:General  Level of Consciousness: awake, alert , and oriented  Airway & Oxygen Therapy: Patient Spontanous Breathing and Patient connected to face mask oxygen  Post-op Assessment: Report given to RN, Post -op Vital signs reviewed and stable, and Patient moving all extremities X 4  Post vital signs: Reviewed and stable  Last Vitals:  Vitals Value Taken Time  BP 156/103   Temp    Pulse 106   Resp 12   SpO2 100     Last Pain:  Vitals:   01/14/23 0553  TempSrc: Oral         Complications: No notable events documented.

## 2023-01-14 NOTE — Anesthesia Postprocedure Evaluation (Signed)
Anesthesia Post Note  Patient: Valree Honda  Procedure(s) Performed: TOTAL THYROIDECTOMY     Patient location during evaluation: PACU Anesthesia Type: General Level of consciousness: awake and alert Pain management: pain level controlled Vital Signs Assessment: post-procedure vital signs reviewed and stable Respiratory status: spontaneous breathing, nonlabored ventilation and respiratory function stable Cardiovascular status: blood pressure returned to baseline and stable Postop Assessment: no apparent nausea or vomiting Anesthetic complications: no  No notable events documented.  Last Vitals:  Vitals:   01/14/23 1150 01/14/23 1215  BP:  (!) 161/99  Pulse: (!) 110 (!) 107  Resp: (!) 22 18  Temp:  36.8 C  SpO2: 95% 99%    Last Pain:  Vitals:   01/14/23 1215  TempSrc: Oral  PainSc:                  Isiaah Cuervo,W. EDMOND

## 2023-01-14 NOTE — Op Note (Signed)
Procedure Note  Pre-operative Diagnosis:  Diffuse thyroid goiter, history of thyrotoxicosis  Post-operative Diagnosis:  same  Surgeon:  Darnell Level, MD  Assistant:  Saunders Glance, PA-C   Procedure:  Total thyroidectomy  Anesthesia:  General  Estimated Blood Loss:  100 cc  Drains: none         Specimen: thyroid to pathology  Indications:  Patient is referred by her endocrinologist, Dr. Dorisann Frames, for consideration for thyroidectomy for treatment of diffuse thyroid goiter with a history of thyrotoxicosis. Patient was first diagnosed approximately 15 years ago with thyroid goiter. Patient experienced gradual increase in size and an attempt was made at suppression using levothyroxine. However, the patient became hyperthyroid with nodules. Fine-needle aspiration biopsies are reportedly benign. Patient subsequently was treated with a dose of radioactive iodine with minimal improvement. The patient has continued to have increase in size of the thyroid gland however her recent TSH levels have been within the normal range with no recurrence of thyrotoxicosis. Her most recent TSH level was 0.729. The patient is not on any thyroid medication including methimazole and beta-blockers. Patient denies any tremors or palpitations. She denies any compressive symptoms. She has no dysphagia. She may have occasional episodes of nocturnal dyspnea. She also notes mild shortness of breath upon raising both arms above her head, Trousseau's sign. Patient had a thyroid ultrasound in 2022 but has not had any recent studies.   Procedure Details: Procedure was done in OR #4 at the [facility]. The patient was brought to the operating room and placed in a supine position on the operating room table. Following administration of general anesthesia, the patient was positioned and then prepped and draped in the usual aseptic fashion. After ascertaining that an adequate level of anesthesia had been achieved, a small  Kocher incision was made with #15 blade. Dissection was carried through subcutaneous tissues and platysma.Hemostasis was achieved with the electrocautery. Skin flaps were elevated cephalad and caudad from the thyroid notch to the sternal notch. A Mahorner self-retaining retractor was placed for exposure. Strap muscles were incised in the midline and dissection was begun on the left side.  Strap muscles were reflected laterally.  Left thyroid lobe was massively enlarged.  The left lobe was gently mobilized with blunt dissection. Superior pole vessels were dissected out and divided individually between small and medium ligaclips with the harmonic scalpel. The thyroid lobe was rolled anteriorly. Branches of the inferior thyroid artery were divided between small and medium ligaclips with the harmonic scalpel. Inferior venous tributaries were divided between small, medium and large ligaclips. Both the superior and inferior parathyroid glands were identified and preserved on their vascular pedicles. The recurrent laryngeal nerve was identified and preserved along its course. The ligament of Allyson Sabal was released with the electrocautery and the gland was mobilized onto the anterior trachea. Isthmus was mobilized across the midline. There was no significant pyramidal lobe present. Dry pack was placed in the left neck.  The right thyroid lobe was gently mobilized with blunt dissection. Right thyroid lobe was markedly enlarged. Superior pole vessels were dissected out and divided between small and medium ligaclips with the Harmonic scalpel. Superior parathyroid was identified and preserved. Inferior venous tributaries were divided between medium ligaclips with the harmonic scalpel. The right thyroid lobe was rolled anteriorly and the branches of the inferior thyroid artery divided between small ligaclips. The right recurrent laryngeal nerve was identified and preserved along its course. The ligament of Allyson Sabal was released with  the electrocautery. The  right thyroid lobe was mobilized onto the anterior trachea and the remainder of the thyroid was dissected off the anterior trachea and the thyroid was completely excised. A suture was used to mark the left lobe. The entire thyroid gland was submitted to pathology for review.  The neck was irrigated with warm saline. Fibrillar was placed throughout the operative field. Strap muscles were approximated in the midline with interrupted 3-0 Vicryl sutures. Platysma was closed with interrupted 3-0 Vicryl sutures. Skin was closed with a running 4-0 Monocryl subcuticular suture. Wound was washed and Dermabond was applied. The patient was awakened from anesthesia and brought to the recovery room. The patient tolerated the procedure well.   Darnell Level, MD [facility] Office: [phone]

## 2023-01-14 NOTE — Plan of Care (Signed)
  Problem: Nutrition: Goal: Adequate nutrition will be maintained Outcome: Progressing   Problem: Pain Managment: Goal: General experience of comfort will improve Outcome: Progressing   

## 2023-01-14 NOTE — Anesthesia Procedure Notes (Signed)
Procedure Name: Intubation Date/Time: 01/14/2023 7:29 AM  Performed by: Nelle Don, CRNAPre-anesthesia Checklist: Patient identified, Emergency Drugs available, Suction available and Patient being monitored Patient Re-evaluated:Patient Re-evaluated prior to induction Oxygen Delivery Method: Circle system utilized Preoxygenation: Pre-oxygenation with 100% oxygen Induction Type: IV induction Ventilation: Two handed mask ventilation required Laryngoscope Size: Mac and 4 Grade View: Grade I Tube type: Oral Tube size: 7.0 mm Number of attempts: 1 Airway Equipment and Method: Stylet Placement Confirmation: ETT inserted through vocal cords under direct vision, positive ETCO2 and breath sounds checked- equal and bilateral Secured at: 22 cm Tube secured with: Tape Dental Injury: Teeth and Oropharynx as per pre-operative assessment

## 2023-01-14 NOTE — Interval H&P Note (Signed)
History and Physical Interval Note:  01/14/2023 6:59 AM  Sandra Fox  has presented today for surgery, with the diagnosis of DIFFUSE THYROID GOITER.  The various methods of treatment have been discussed with the patient and family. After consideration of risks, benefits and other options for treatment, the patient has consented to    Procedure(s): TOTAL THYROIDECTOMY (N/A) as a surgical intervention.    The patient's history has been reviewed, patient examined, no change in status, stable for surgery.  I have reviewed the patient's chart and labs.  Questions were answered to the patient's satisfaction.    Darnell Level, MD [facility] practice Office: [phone]   Darnell Level

## 2023-01-15 ENCOUNTER — Encounter (HOSPITAL_COMMUNITY): Payer: Self-pay | Admitting: Surgery

## 2023-01-15 DIAGNOSIS — E049 Nontoxic goiter, unspecified: Secondary | ICD-10-CM | POA: Diagnosis not present

## 2023-01-15 MED ORDER — TRAMADOL HCL 50 MG PO TABS: 50.0000 mg | ORAL_TABLET | Freq: Four times a day (QID) | ORAL | 0 refills | Status: AC | PRN

## 2023-01-15 MED ORDER — LEVOTHYROXINE SODIUM 100 MCG PO TABS
100.0000 ug | ORAL_TABLET | Freq: Every day | ORAL | 3 refills | Status: AC
Start: 2023-01-15 — End: 2024-01-15

## 2023-01-15 MED ORDER — CALCIUM GLUCONATE-NACL 2-0.675 GM/100ML-% IV SOLN
2.0000 g | INTRAVENOUS | Status: AC
  Administered 2023-01-15: 2000 mg via INTRAVENOUS
  Filled 2023-01-15: qty 100

## 2023-01-15 MED ORDER — CALCIUM CARBONATE ANTACID 500 MG PO CHEW: 2.0000 | CHEWABLE_TABLET | Freq: Three times a day (TID) | ORAL | 1 refills | Status: AC

## 2023-01-15 NOTE — Progress Notes (Signed)
The patient is alert,oriented x4, ambulatory without assistance. No change in am assessment. Incision to anterior neck, clean dry intact. No erythema or ecchymosis, mild edema located near incision non pitting. Pt denies complaints of any kind. No sob or difficulty swallowing. She has been tolerating po's well. Dicharge instructions reviewed. Questions, concerns denied at this time.

## 2023-01-15 NOTE — Progress Notes (Signed)
   01/15/23 1142  TOC Brief Assessment  Insurance and Status Reviewed  Patient has primary care physician Yes  Home environment has been reviewed Resides with significant other  Prior level of function: Independent at baseline  Prior/Current Home Services No current home services  Social Determinants of Health Reivew SDOH reviewed no interventions necessary  Readmission risk has been reviewed Yes  Transition of care needs no transition of care needs at this time

## 2023-01-15 NOTE — Plan of Care (Signed)
  Problem: Education: Goal: Knowledge of General Education information will improve Description Including pain rating scale, medication(s)/side effects and non-pharmacologic comfort measures Outcome: Progressing   Problem: Health Behavior/Discharge Planning: Goal: Ability to manage health-related needs will improve Outcome: Progressing   

## 2023-01-15 NOTE — Discharge Instructions (Signed)

## 2023-01-15 NOTE — Progress Notes (Signed)
Final pathology benign, as expected.  You're doing great!  tmg  Darnell Level, MD [facility] practice Office: [phone]

## 2023-02-06 DIAGNOSIS — E890 Postprocedural hypothyroidism: Secondary | ICD-10-CM | POA: Diagnosis not present

## 2023-02-06 DIAGNOSIS — E559 Vitamin D deficiency, unspecified: Secondary | ICD-10-CM | POA: Diagnosis not present

## 2023-04-11 DIAGNOSIS — E890 Postprocedural hypothyroidism: Secondary | ICD-10-CM | POA: Diagnosis not present

## 2023-04-11 DIAGNOSIS — E559 Vitamin D deficiency, unspecified: Secondary | ICD-10-CM | POA: Diagnosis not present

## 2023-06-10 DIAGNOSIS — E890 Postprocedural hypothyroidism: Secondary | ICD-10-CM | POA: Diagnosis not present

## 2023-06-10 DIAGNOSIS — E559 Vitamin D deficiency, unspecified: Secondary | ICD-10-CM | POA: Diagnosis not present

## 2023-07-10 ENCOUNTER — Other Ambulatory Visit: Payer: Self-pay | Admitting: Medical Genetics

## 2023-10-16 ENCOUNTER — Other Ambulatory Visit: Payer: Self-pay | Admitting: Internal Medicine

## 2023-10-16 DIAGNOSIS — Z1231 Encounter for screening mammogram for malignant neoplasm of breast: Secondary | ICD-10-CM

## 2023-10-24 ENCOUNTER — Other Ambulatory Visit (HOSPITAL_COMMUNITY): Payer: Self-pay

## 2023-10-24 ENCOUNTER — Ambulatory Visit
Admission: RE | Admit: 2023-10-24 | Discharge: 2023-10-24 | Disposition: A | Discharge status: Home / Self Care | Source: Ambulatory Visit

## 2023-10-24 DIAGNOSIS — Z1231 Encounter for screening mammogram for malignant neoplasm of breast: Secondary | ICD-10-CM

## 2023-10-24 DIAGNOSIS — Z0000 Encounter for general adult medical examination without abnormal findings: Secondary | ICD-10-CM | POA: Diagnosis not present

## 2024-03-24 ENCOUNTER — Other Ambulatory Visit: Payer: Self-pay | Admitting: Endocrinology

## 2024-03-24 ENCOUNTER — Encounter: Payer: Self-pay | Admitting: Endocrinology

## 2024-03-24 DIAGNOSIS — R221 Localized swelling, mass and lump, neck: Secondary | ICD-10-CM

## 2024-04-03 ENCOUNTER — Ambulatory Visit
Admission: RE | Admit: 2024-04-03 | Discharge: 2024-04-03 | Disposition: A | Source: Ambulatory Visit | Attending: Endocrinology | Admitting: Endocrinology

## 2024-04-03 DIAGNOSIS — R221 Localized swelling, mass and lump, neck: Secondary | ICD-10-CM

## 2024-04-09 ENCOUNTER — Other Ambulatory Visit: Payer: Self-pay | Admitting: Medical Genetics

## 2024-04-09 DIAGNOSIS — Z006 Encounter for examination for normal comparison and control in clinical research program: Secondary | ICD-10-CM
